# Patient Record
Sex: Female | Born: 1990 | Race: Black or African American | Hispanic: No | Marital: Single | Smoking: Never smoker
Health system: Southern US, Community
[De-identification: ages and names within clinical notes are randomized; demographics above are authoritative.]

## PROBLEM LIST (undated history)

## (undated) HISTORY — PX: WISDOM TOOTH EXTRACTION: SHX21

---
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
## 2010-03-30 ENCOUNTER — Emergency Department (HOSPITAL_COMMUNITY)
Admission: EM | Admit: 2010-03-30 | Discharge: 2010-03-31 | Payer: Self-pay | Source: Home / Self Care | Admitting: Emergency Medicine

## 2011-03-31 ENCOUNTER — Emergency Department (HOSPITAL_COMMUNITY)
Admission: EM | Admit: 2011-03-31 | Discharge: 2011-03-31 | Disposition: A | Discharge status: Home / Self Care | Payer: No Typology Code available for payment source | Attending: Emergency Medicine | Admitting: Emergency Medicine

## 2011-03-31 ENCOUNTER — Emergency Department (HOSPITAL_COMMUNITY): Payer: No Typology Code available for payment source

## 2011-03-31 DIAGNOSIS — M542 Cervicalgia: Secondary | ICD-10-CM | POA: Insufficient documentation

## 2011-03-31 DIAGNOSIS — M549 Dorsalgia, unspecified: Secondary | ICD-10-CM | POA: Insufficient documentation

## 2011-03-31 DIAGNOSIS — S161XXA Strain of muscle, fascia and tendon at neck level, initial encounter: Secondary | ICD-10-CM

## 2011-03-31 DIAGNOSIS — S139XXA Sprain of joints and ligaments of unspecified parts of neck, initial encounter: Secondary | ICD-10-CM | POA: Insufficient documentation

## 2011-03-31 DIAGNOSIS — R51 Headache: Secondary | ICD-10-CM | POA: Insufficient documentation

## 2011-03-31 DIAGNOSIS — Y92410 Unspecified street and highway as the place of occurrence of the external cause: Secondary | ICD-10-CM | POA: Insufficient documentation

## 2011-03-31 MED ORDER — HYDROCODONE-ACETAMINOPHEN 5-500 MG PO TABS: 1.0000 | ORAL_TABLET | Freq: Four times a day (QID) | ORAL | Status: AC | PRN

## 2011-03-31 MED ORDER — CYCLOBENZAPRINE HCL 10 MG PO TABS: 10.0000 mg | ORAL_TABLET | Freq: Two times a day (BID) | ORAL | Status: AC | PRN

## 2011-03-31 MED ORDER — IBUPROFEN 800 MG PO TABS
800.0000 mg | ORAL_TABLET | Freq: Once | ORAL | Status: AC
  Administered 2011-03-31: 800 mg via ORAL
  Filled 2011-03-31: qty 1

## 2011-03-31 MED ORDER — NAPROXEN 500 MG PO TABS: 500.0000 mg | ORAL_TABLET | Freq: Two times a day (BID) | ORAL | Status: AC

## 2011-03-31 MED ORDER — HYDROCODONE-ACETAMINOPHEN 5-325 MG PO TABS
1.0000 | ORAL_TABLET | Freq: Once | ORAL | Status: AC
  Administered 2011-03-31: 1 via ORAL
  Filled 2011-03-31: qty 1

## 2011-03-31 NOTE — ED Notes (Signed)
Sedation END--ERROR

## 2011-03-31 NOTE — ED Notes (Signed)
ERROR--SEDATION NOTE IS ERROR!!!

## 2011-03-31 NOTE — ED Notes (Signed)
Taken to x-ray via carrier--Stable 
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
## 2012-03-02 ENCOUNTER — Encounter (HOSPITAL_COMMUNITY): Payer: Self-pay | Admitting: *Deleted

## 2012-03-02 ENCOUNTER — Emergency Department (HOSPITAL_COMMUNITY): Payer: Medicaid Other

## 2012-03-02 ENCOUNTER — Emergency Department (HOSPITAL_COMMUNITY)
Admission: EM | Admit: 2012-03-02 | Discharge: 2012-03-02 | Disposition: A | Discharge status: Home / Self Care | Payer: Medicaid Other | Attending: Emergency Medicine | Admitting: Emergency Medicine

## 2012-03-02 DIAGNOSIS — S62308A Unspecified fracture of other metacarpal bone, initial encounter for closed fracture: Secondary | ICD-10-CM

## 2012-03-02 DIAGNOSIS — M79609 Pain in unspecified limb: Secondary | ICD-10-CM | POA: Insufficient documentation

## 2012-03-02 DIAGNOSIS — Y92009 Unspecified place in unspecified non-institutional (private) residence as the place of occurrence of the external cause: Secondary | ICD-10-CM | POA: Insufficient documentation

## 2012-03-02 DIAGNOSIS — S62309A Unspecified fracture of unspecified metacarpal bone, initial encounter for closed fracture: Secondary | ICD-10-CM | POA: Insufficient documentation

## 2012-03-02 DIAGNOSIS — M7989 Other specified soft tissue disorders: Secondary | ICD-10-CM | POA: Insufficient documentation

## 2012-03-02 DIAGNOSIS — IMO0002 Reserved for concepts with insufficient information to code with codable children: Secondary | ICD-10-CM | POA: Insufficient documentation

## 2012-03-02 NOTE — ED Provider Notes (Signed)
Medical screening examination/treatment/procedure(s) were performed by non-physician practitioner and as supervising physician I was immediately available for consultation/collaboration.   Benny Lennert, MD 03/02/12 2150

## 2012-03-02 NOTE — ED Notes (Signed)
R. Miller, PA at bedside  
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
## 2012-11-27 ENCOUNTER — Emergency Department (HOSPITAL_COMMUNITY)
Admission: EM | Admit: 2012-11-27 | Discharge: 2012-11-27 | Disposition: A | Discharge status: Home / Self Care | Payer: Self-pay | Attending: Emergency Medicine | Admitting: Emergency Medicine

## 2012-11-27 ENCOUNTER — Emergency Department (HOSPITAL_COMMUNITY): Payer: Self-pay

## 2012-11-27 ENCOUNTER — Encounter (HOSPITAL_COMMUNITY): Payer: Self-pay | Admitting: *Deleted

## 2012-11-27 DIAGNOSIS — Z23 Encounter for immunization: Secondary | ICD-10-CM | POA: Insufficient documentation

## 2012-11-27 DIAGNOSIS — L089 Local infection of the skin and subcutaneous tissue, unspecified: Secondary | ICD-10-CM

## 2012-11-27 DIAGNOSIS — Y92410 Unspecified street and highway as the place of occurrence of the external cause: Secondary | ICD-10-CM | POA: Insufficient documentation

## 2012-11-27 DIAGNOSIS — W010XXA Fall on same level from slipping, tripping and stumbling without subsequent striking against object, initial encounter: Secondary | ICD-10-CM | POA: Insufficient documentation

## 2012-11-27 DIAGNOSIS — Y939 Activity, unspecified: Secondary | ICD-10-CM | POA: Insufficient documentation

## 2012-11-27 DIAGNOSIS — Z91040 Latex allergy status: Secondary | ICD-10-CM | POA: Insufficient documentation

## 2012-11-27 DIAGNOSIS — IMO0002 Reserved for concepts with insufficient information to code with codable children: Secondary | ICD-10-CM | POA: Insufficient documentation

## 2012-11-27 MED ORDER — BACITRACIN ZINC 500 UNIT/GM EX OINT
TOPICAL_OINTMENT | CUTANEOUS | Status: AC
  Administered 2012-11-27: 1 via TOPICAL
  Filled 2012-11-27: qty 0.9

## 2012-11-27 MED ORDER — TETANUS-DIPHTH-ACELL PERTUSSIS 5-2.5-18.5 LF-MCG/0.5 IM SUSP
0.5000 mL | Freq: Once | INTRAMUSCULAR | Status: AC
  Administered 2012-11-27: 0.5 mL via INTRAMUSCULAR
  Filled 2012-11-27: qty 0.5

## 2012-11-27 MED ORDER — BACITRACIN ZINC 500 UNIT/GM EX OINT
TOPICAL_OINTMENT | Freq: Two times a day (BID) | CUTANEOUS | Status: DC
  Administered 2012-11-27: 1 via TOPICAL
  Filled 2012-11-27: qty 0.9

## 2012-11-27 MED ORDER — IBUPROFEN 600 MG PO TABS: 600.0000 mg | ORAL_TABLET | Freq: Four times a day (QID) | ORAL | Status: DC | PRN

## 2012-11-27 MED ORDER — IBUPROFEN 800 MG PO TABS
800.0000 mg | ORAL_TABLET | Freq: Once | ORAL | Status: AC
  Administered 2012-11-27: 800 mg via ORAL
  Filled 2012-11-27: qty 1
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
## 2012-11-29 NOTE — ED Provider Notes (Signed)
No att. providers found   Medical screening examination/treatment/procedure(s) were performed by non-physician practitioner and as supervising physician I was immediately available for consultation/collaboration.  Shelda Jakes, MD 11/29/12 [phone]

## 2013-12-02 ENCOUNTER — Emergency Department (HOSPITAL_COMMUNITY)
Admission: EM | Admit: 2013-12-02 | Discharge: 2013-12-02 | Disposition: A | Discharge status: Home / Self Care | Payer: Medicaid Other | Attending: Emergency Medicine | Admitting: Emergency Medicine

## 2013-12-02 ENCOUNTER — Encounter (HOSPITAL_COMMUNITY): Payer: Self-pay | Admitting: Emergency Medicine

## 2013-12-02 DIAGNOSIS — W503XXA Accidental bite by another person, initial encounter: Secondary | ICD-10-CM

## 2013-12-02 DIAGNOSIS — Z91040 Latex allergy status: Secondary | ICD-10-CM | POA: Insufficient documentation

## 2013-12-02 DIAGNOSIS — IMO0002 Reserved for concepts with insufficient information to code with codable children: Secondary | ICD-10-CM | POA: Insufficient documentation

## 2013-12-02 MED ORDER — BACITRACIN-NEOMYCIN-POLYMYXIN 400-5-5000 EX OINT
TOPICAL_OINTMENT | Freq: Once | CUTANEOUS | Status: AC
  Administered 2013-12-02: 13:00:00 via TOPICAL
  Filled 2013-12-02: qty 1

## 2013-12-02 MED ORDER — TRAMADOL HCL 50 MG PO TABS: 50.0000 mg | ORAL_TABLET | Freq: Four times a day (QID) | ORAL | Status: AC | PRN

## 2013-12-02 MED ORDER — AMOXICILLIN-POT CLAVULANATE 875-125 MG PO TABS: 1.0000 | ORAL_TABLET | Freq: Two times a day (BID) | ORAL | Status: DC
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
## 2013-12-02 NOTE — ED Provider Notes (Signed)
Medical screening examination/treatment/procedure(s) were performed by non-physician practitioner and as supervising physician I was immediately available for consultation/collaboration.   EKG Interpretation None       Rital Cavey R. Theotis Gerdeman, MD 12/02/13 1330 
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
## 2018-05-12 ENCOUNTER — Encounter (HOSPITAL_COMMUNITY): Payer: Self-pay | Admitting: Emergency Medicine

## 2018-05-12 ENCOUNTER — Emergency Department (HOSPITAL_COMMUNITY): Payer: BLUE CROSS/BLUE SHIELD

## 2018-05-12 ENCOUNTER — Emergency Department (HOSPITAL_COMMUNITY)
Admission: EM | Admit: 2018-05-12 | Discharge: 2018-05-12 | Disposition: A | Discharge status: Home / Self Care | Payer: BLUE CROSS/BLUE SHIELD | Attending: Emergency Medicine | Admitting: Emergency Medicine

## 2018-05-12 ENCOUNTER — Other Ambulatory Visit: Payer: Self-pay

## 2018-05-12 DIAGNOSIS — S46912A Strain of unspecified muscle, fascia and tendon at shoulder and upper arm level, left arm, initial encounter: Secondary | ICD-10-CM | POA: Insufficient documentation

## 2018-05-12 DIAGNOSIS — X509XXA Other and unspecified overexertion or strenuous movements or postures, initial encounter: Secondary | ICD-10-CM | POA: Diagnosis not present

## 2018-05-12 DIAGNOSIS — Y9389 Activity, other specified: Secondary | ICD-10-CM | POA: Insufficient documentation

## 2018-05-12 DIAGNOSIS — Y9289 Other specified places as the place of occurrence of the external cause: Secondary | ICD-10-CM | POA: Diagnosis not present

## 2018-05-12 DIAGNOSIS — S4992XA Unspecified injury of left shoulder and upper arm, initial encounter: Secondary | ICD-10-CM | POA: Diagnosis present

## 2018-05-12 DIAGNOSIS — Y990 Civilian activity done for income or pay: Secondary | ICD-10-CM | POA: Insufficient documentation

## 2018-05-12 DIAGNOSIS — Z91040 Latex allergy status: Secondary | ICD-10-CM | POA: Insufficient documentation

## 2018-05-12 MED ORDER — IBUPROFEN 600 MG PO TABS: 600.0000 mg | ORAL_TABLET | Freq: Four times a day (QID) | ORAL | 0 refills | Status: AC | PRN
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
## 2018-05-12 NOTE — Discharge Instructions (Addendum)
I suspect you have strained your rotator cuff muscles/tendons of your left shoulder.  Minimize use of the shoulder (avoid heavy lifting and overhead work) as discussed for the next week. Plan a recheck by Dr. Romeo Apple if your symptoms persist or worsen.  Your xrays are normal.  Apply ice as much as possible, and also apply a heating pad for 20 minutes 3 times daily.

## 2018-05-27 ENCOUNTER — Ambulatory Visit: Payer: BLUE CROSS/BLUE SHIELD | Admitting: Orthopedic Surgery

## 2018-05-27 ENCOUNTER — Encounter: Payer: Self-pay | Admitting: Orthopedic Surgery

## 2018-05-27 VITALS — BP 131/89 | HR 71 | Ht 68.0 in | Wt 166.0 lb

## 2018-05-27 DIAGNOSIS — M778 Other enthesopathies, not elsewhere classified: Secondary | ICD-10-CM

## 2018-05-27 DIAGNOSIS — M7582 Other shoulder lesions, left shoulder: Secondary | ICD-10-CM | POA: Diagnosis not present

## 2018-05-27 NOTE — Progress Notes (Signed)
  NEW PATIENT OFFICE VISIT  Chief Complaint  Patient presents with  . 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  She reports improvement with ibuprofen.  She had pain for 2 weeks and went to bed with some soreness woke up with inability to move her left arm and shoulder.  At that time had severe pain and loss of motion but now comes in with mild discomfort in resolution of motion loss ROS  Denies numbness tingling skin rash fever chills History reviewed. No pertinent past medical history.  Past Surgical History:  Procedure Laterality Date  . WISDOM TOOTH EXTRACTION      Family History  Problem Relation Age of Onset  . Healthy Mother   . Healthy Father    Social History   Tobacco Use  . Smoking status: Never Smoker  . Smokeless tobacco: Never Used  Substance Use Topics  . Alcohol use: Yes    Comment: occasional  . Drug use: No    Allergies  Allergen Reactions  . Latex     No outpatient medications have been marked as taking for the 05/27/18 encounter (Office Visit) with Vickki Hearing,  E, MD.    BP 131/89   Pulse 71   Ht 5\' 8"  (1.727 m)   Wt 166 lb (75.3 kg)   BMI 25.24 kg/m   Physical Exam  Constitutional: She is oriented to person, place, and time. She appears well-developed and well-nourished.  Neurological: She is alert and oriented to person, place, and time.  Psychiatric: She has a normal mood and affect. Judgment normal.  Vitals reviewed.   Ortho Exam  Normal gait right shoulder no tenderness full range of motion shoulder is stable in abduction external rotation normal cuff strength skin is normal she has a good pulse and sensation is intact  On the left shoulder she again has full range of motion pain at terminal flexion shoulder is stable no tenderness around the shoulder mild  tenderness in the supraspinatus fossa and cervical spine with full range of motion of the cervical spine cuff strength is normal skin is intact pulses are good sensation is normal MEDICAL DECISION SECTION  [facility]  My independent reading of xrays:  3 views of the shoulder no fracture dislocation no glenohumeral arthritis she does have a os acromiale he  Encounter Diagnosis  Name Primary?  . Tendonitis of shoulder, left Yes    PLAN: (Rx., injectx, surgery, frx, mri/ct) Recommend Codman exercises Advil as needed for pain return if she has any recurrence of symptoms  No orders of the defined types were placed in this encounter.   Fuller Canada , MD  05/27/2018 9:38 AM

## 2018-05-27 NOTE — Patient Instructions (Signed)
advil as needed   Shoulder exercises for 4 weeks once per day

## 2018-12-25 ENCOUNTER — Emergency Department (HOSPITAL_COMMUNITY): Payer: BC Managed Care – PPO

## 2018-12-25 ENCOUNTER — Encounter (HOSPITAL_COMMUNITY): Payer: Self-pay | Admitting: Emergency Medicine

## 2018-12-25 ENCOUNTER — Other Ambulatory Visit: Payer: Self-pay

## 2018-12-25 ENCOUNTER — Emergency Department (HOSPITAL_COMMUNITY)
Admission: EM | Admit: 2018-12-25 | Discharge: 2018-12-25 | Disposition: A | Discharge status: Home / Self Care | Payer: BC Managed Care – PPO | Attending: Emergency Medicine | Admitting: Emergency Medicine

## 2018-12-25 DIAGNOSIS — Y9389 Activity, other specified: Secondary | ICD-10-CM | POA: Diagnosis not present

## 2018-12-25 DIAGNOSIS — S299XXA Unspecified injury of thorax, initial encounter: Secondary | ICD-10-CM | POA: Diagnosis present

## 2018-12-25 DIAGNOSIS — Y92410 Unspecified street and highway as the place of occurrence of the external cause: Secondary | ICD-10-CM | POA: Diagnosis not present

## 2018-12-25 DIAGNOSIS — S0990XA Unspecified injury of head, initial encounter: Secondary | ICD-10-CM | POA: Diagnosis not present

## 2018-12-25 DIAGNOSIS — S86912A Strain of unspecified muscle(s) and tendon(s) at lower leg level, left leg, initial encounter: Secondary | ICD-10-CM

## 2018-12-25 DIAGNOSIS — S20219A Contusion of unspecified front wall of thorax, initial encounter: Secondary | ICD-10-CM

## 2018-12-25 DIAGNOSIS — Y999 Unspecified external cause status: Secondary | ICD-10-CM | POA: Insufficient documentation

## 2018-12-25 NOTE — Discharge Instructions (Signed)
Take Tylenol and Motrin for pain use ice as needed. Return for shortness of breath or new or worsening symptoms.  You will be sore for the next 2 or 3 days.  Work note attached.
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
## 2018-12-26 ENCOUNTER — Emergency Department (HOSPITAL_COMMUNITY)
Admission: EM | Admit: 2018-12-26 | Discharge: 2018-12-27 | Disposition: A | Discharge status: Home / Self Care | Payer: BC Managed Care – PPO | Attending: Emergency Medicine | Admitting: Emergency Medicine

## 2018-12-26 ENCOUNTER — Other Ambulatory Visit: Payer: Self-pay

## 2018-12-26 ENCOUNTER — Encounter (HOSPITAL_COMMUNITY): Payer: Self-pay | Admitting: Emergency Medicine

## 2018-12-26 DIAGNOSIS — R0789 Other chest pain: Secondary | ICD-10-CM | POA: Diagnosis not present

## 2018-12-26 DIAGNOSIS — M25562 Pain in left knee: Secondary | ICD-10-CM | POA: Diagnosis present

## 2018-12-26 DIAGNOSIS — S838X2S Sprain of other specified parts of left knee, sequela: Secondary | ICD-10-CM

## 2018-12-26 DIAGNOSIS — S838X2D Sprain of other specified parts of left knee, subsequent encounter: Secondary | ICD-10-CM | POA: Insufficient documentation

## 2018-12-26 DIAGNOSIS — Z91040 Latex allergy status: Secondary | ICD-10-CM | POA: Diagnosis not present

## 2018-12-26 NOTE — ED Notes (Signed)
Pt states her neck and knee hurts and that she needs a work note until Saturday.
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
## 2020-12-10 ENCOUNTER — Other Ambulatory Visit: Payer: Self-pay

## 2020-12-10 ENCOUNTER — Emergency Department (HOSPITAL_COMMUNITY)
Admission: EM | Admit: 2020-12-10 | Discharge: 2020-12-10 | Disposition: A | Discharge status: Home / Self Care | Payer: 59 | Attending: Emergency Medicine | Admitting: Emergency Medicine

## 2020-12-10 ENCOUNTER — Encounter (HOSPITAL_COMMUNITY): Payer: Self-pay | Admitting: *Deleted

## 2020-12-10 ENCOUNTER — Emergency Department (HOSPITAL_COMMUNITY): Payer: 59

## 2020-12-10 DIAGNOSIS — S8012XA Contusion of left lower leg, initial encounter: Secondary | ICD-10-CM | POA: Insufficient documentation

## 2020-12-10 DIAGNOSIS — S5012XA Contusion of left forearm, initial encounter: Secondary | ICD-10-CM | POA: Diagnosis not present

## 2020-12-10 DIAGNOSIS — S20319A Abrasion of unspecified front wall of thorax, initial encounter: Secondary | ICD-10-CM | POA: Insufficient documentation

## 2020-12-10 DIAGNOSIS — Z91040 Latex allergy status: Secondary | ICD-10-CM | POA: Insufficient documentation

## 2020-12-10 DIAGNOSIS — S1091XA Abrasion of unspecified part of neck, initial encounter: Secondary | ICD-10-CM | POA: Insufficient documentation

## 2020-12-10 DIAGNOSIS — Y92410 Unspecified street and highway as the place of occurrence of the external cause: Secondary | ICD-10-CM | POA: Insufficient documentation

## 2020-12-10 DIAGNOSIS — S59919A Unspecified injury of unspecified forearm, initial encounter: Secondary | ICD-10-CM | POA: Diagnosis present

## 2020-12-10 DIAGNOSIS — T07XXXA Unspecified multiple injuries, initial encounter: Secondary | ICD-10-CM

## 2020-12-10 DIAGNOSIS — M7918 Myalgia, other site: Secondary | ICD-10-CM

## 2020-12-10 LAB — CBG MONITORING, ED: Glucose-Capillary: 84 mg/dL (ref 70–99)

## 2020-12-10 MED ORDER — NAPROXEN 500 MG PO TABS: 500.0000 mg | ORAL_TABLET | Freq: Two times a day (BID) | ORAL | 0 refills | Status: AC

## 2020-12-10 MED ORDER — CYCLOBENZAPRINE HCL 10 MG PO TABS: 10.0000 mg | ORAL_TABLET | Freq: Two times a day (BID) | ORAL | 0 refills | Status: AC | PRN

## 2020-12-10 NOTE — Discharge Instructions (Addendum)
Apply antibiotic ointment as needed to abrasions. Take naproxen and Flexeril as needed as directed for pain and muscle soreness. Apply warm compresses to sore muscles for 20 minutes at a time followed by gentle stretching. Recheck with your doctor for pain not improving after 3 to 5 days.

## 2020-12-10 NOTE — ED Notes (Signed)
Patient's mother called stating that patient is diabetic and thinks her blood sugar is dropping and will not tell anyone. Triage nurse made aware and will check CBG
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
## 2022-12-14 IMAGING — DX DG STERNUM 2+V
2 series · 2 of 2 positions shown · non-contrast
Comparison: Chest radiographs dated 12/25/2018.

CLINICAL DATA: Sternal pain following an injury.

EXAM:
STERNUM - 2+ VIEW

[chest pa]
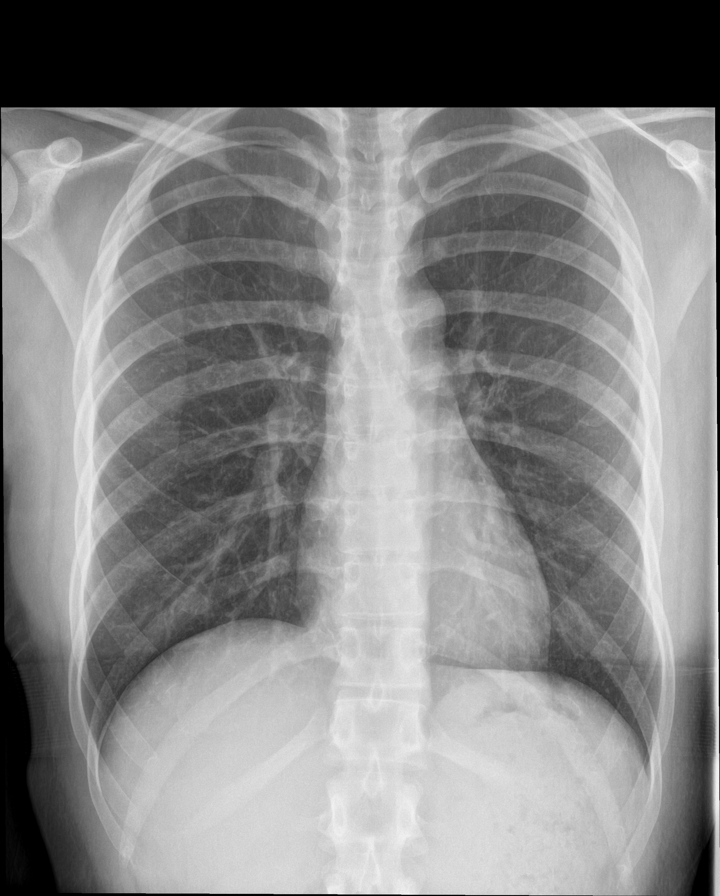

[sternum lat]
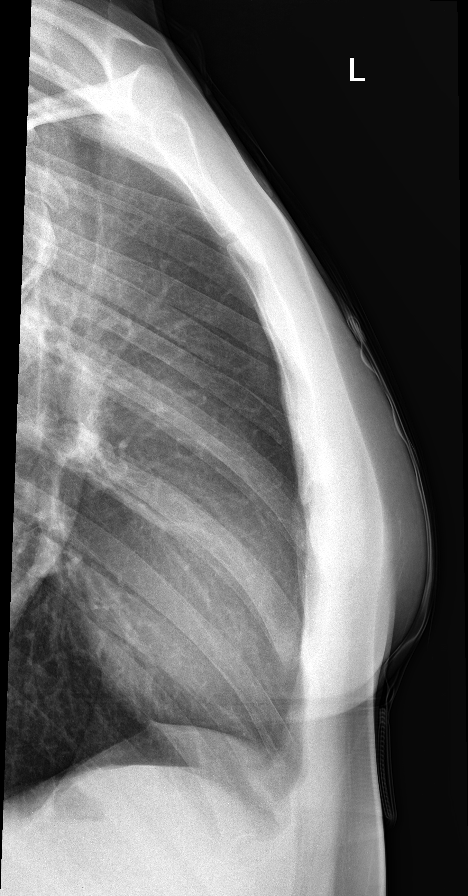

[2 of 2 positions shown; findings below may reference images not displayed]

FINDINGS: Normal sized heart. Clear lungs. Normal appearing sternum with no
fracture seen.
IMPRESSION: Normal examination.
